# Patient Record
Sex: Female | Born: 1970 | Hispanic: No
Health system: Southern US, Community
[De-identification: ages and names within clinical notes are randomized; demographics above are authoritative.]

## PROBLEM LIST (undated history)

## (undated) DIAGNOSIS — G4700 Insomnia, unspecified: Secondary | ICD-10-CM

## (undated) DIAGNOSIS — F419 Anxiety disorder, unspecified: Secondary | ICD-10-CM

## (undated) HISTORY — DX: Insomnia, unspecified: G47.00

## (undated) HISTORY — DX: Anxiety disorder, unspecified: F41.9

## (undated) HISTORY — PX: BREAST BIOPSY: SHX20

---

## 2009-08-01 ENCOUNTER — Encounter: Admission: RE | Admit: 2009-08-01 | Discharge: 2009-08-01 | Payer: Self-pay | Admitting: Internal Medicine

## 2010-07-02 NOTE — Miscellaneous (Signed)
°

## 2010-07-25 NOTE — Miscellaneous (Signed)
°

## 2018-05-05 ENCOUNTER — Ambulatory Visit: Payer: BC Managed Care – PPO | Admitting: Podiatry

## 2018-05-05 ENCOUNTER — Ambulatory Visit (INDEPENDENT_AMBULATORY_CARE_PROVIDER_SITE_OTHER): Payer: BC Managed Care – PPO

## 2018-05-05 ENCOUNTER — Other Ambulatory Visit: Payer: Self-pay | Admitting: Podiatry

## 2018-05-05 DIAGNOSIS — M76821 Posterior tibial tendinitis, right leg: Secondary | ICD-10-CM

## 2018-05-05 DIAGNOSIS — M7751 Other enthesopathy of right foot: Secondary | ICD-10-CM

## 2018-05-05 DIAGNOSIS — M779 Enthesopathy, unspecified: Secondary | ICD-10-CM

## 2018-05-05 DIAGNOSIS — M79671 Pain in right foot: Secondary | ICD-10-CM

## 2018-05-05 MED ORDER — MELOXICAM 15 MG PO TABS: 15.0000 mg | ORAL_TABLET | Freq: Every day | ORAL | 0 refills | Status: AC

## 2018-05-05 NOTE — Progress Notes (Signed)
Subjective:   Patient ID: Alexandra Guerra, female   DOB: 47 y.o.   MRN: [id number]   HPI 47 year old female presents the office today for concerns of right foot and ankle pain.  She states that about 6 months ago she started developed some pain in the leg and started moved to the ankle now is going into the foot.  He states that the only relief that she is really had is she wraps her foot.  She uses compression wrap.  She does wear Hush Puppies which seem to help as well.  She denies any recent injury or trauma the time of onset of symptoms and she denies any numbness or tingling.  She had one episode of swelling she used ice for not seem to help with that.  She also previously did see orthopedics for this and she was told she had flatfeet and she was given an Ace bandage.  She discusses dull pain is sometimes sharp more of a stiffness.  She has no other concerns.   Review of Systems  All other systems reviewed and are negative.  No past medical history on file.     Current Outpatient Medications:    meloxicam (MOBIC) 15 MG tablet, Take 1 tablet (15 mg total) by mouth daily., Disp: 30 tablet, Rfl: 0  Allergies not on file       Objective:  Physical Exam  General: AAO x3, NAD  Dermatological: Skin is warm, dry and supple bilateral. Nails x 10 are well manicured; remaining integument appears unremarkable at this time. There are no open sores, no preulcerative lesions, no rash or signs of infection present.  Vascular: Dorsalis Pedis artery and Posterior Tibial artery pedal pulses are 2/4 bilateral with immedate capillary fill time. There is no pain with calf compression, swelling, warmth, erythema.   Neruologic: Grossly intact via light touch bilateral. Protective threshold with Semmes Wienstein monofilament intact to all pedal sites bilateral.   Musculoskeletal: There is a mild decrease in medial arch on the right side compared to the contralateral extremity.  There is tenderness  palpation just posterior to the medial malleolus on the course the posterior tibial tendon.  There is mild discomfort on the Achilles tendon but overall the tendons appear to be intact.  She is able to do a double heel rise.  She has discomfort with doing a single heel rise on the right side.  There is no area pinpoint tenderness.  She still gets some tenderness on the arch of the foot on the plantar fascia but overall plantar fascial has been tight.  There is no significant swelling identified to the right lower extremity.  Muscular strength 5/5 in all groups tested bilateral.  Gait: Unassisted, Nonantalgic.       Assessment:   47 year old female with right PTTD/tendonitis    Plan:  -Treatment options discussed including all alternatives, risks, and complications -Etiology of symptoms were discussed -X-rays were obtained and reviewed with the patient. There is no evidence of acute fracture or stress fracture. -Prescribed mobic. Discussed side effects of the medication and directed to stop if any are to occur and call the office.  -Trilock ankle brace dispensed.  -We will start range of motion, rehab exercises as well for postsurgical tendinitis, plantar fasciitis. -We will check orthotic coverage for her as well.  I will see her in about 3 weeks however there is no improvement we ordered an MRI.  Vivi BarrackMatthew R Marice Guidone DPM

## 2018-05-05 NOTE — Patient Instructions (Signed)
Plantar Fasciitis (Heel Spur Syndrome) with Rehab The plantar fascia is a fibrous, ligament-like, soft-tissue structure that spans the bottom of the foot. Plantar fasciitis is a condition that causes pain in the foot due to inflammation of the tissue. SYMPTOMS   Pain and tenderness on the underneath side of the foot.  Pain that worsens with standing or walking. CAUSES  Plantar fasciitis is caused by irritation and injury to the plantar fascia on the underneath side of the foot. Common mechanisms of injury include:  Direct trauma to bottom of the foot.  Damage to a small nerve that runs under the foot where the main fascia attaches to the heel bone.  Stress placed on the plantar fascia due to bone spurs. RISK INCREASES WITH:   Activities that place stress on the plantar fascia (running, jumping, pivoting, or cutting).  Poor strength and flexibility.  Improperly fitted shoes.  Tight calf muscles.  Flat feet.  Failure to warm-up properly before activity.  Obesity. PREVENTION  Warm up and stretch properly before activity.  Allow for adequate recovery between workouts.  Maintain physical fitness:  Strength, flexibility, and endurance.  Cardiovascular fitness.  Maintain a health body weight.  Avoid stress on the plantar fascia.  Wear properly fitted shoes, including arch supports for individuals who have flat feet.  PROGNOSIS  If treated properly, then the symptoms of plantar fasciitis usually resolve without surgery. However, occasionally surgery is necessary.  RELATED COMPLICATIONS   Recurrent symptoms that may result in a chronic condition.  Problems of the lower back that are caused by compensating for the injury, such as limping.  Pain or weakness of the foot during push-off following surgery.  Chronic inflammation, scarring, and partial or complete fascia tear, occurring more often from repeated injections.  TREATMENT  Treatment initially involves  the use of ice and medication to help reduce pain and inflammation. The use of strengthening and stretching exercises may help reduce pain with activity, especially stretches of the Achilles tendon. These exercises may be performed at home or with a therapist. Your caregiver may recommend that you use heel cups of arch supports to help reduce stress on the plantar fascia. Occasionally, corticosteroid injections are given to reduce inflammation. If symptoms persist for greater than 6 months despite non-surgical (conservative), then surgery may be recommended.   MEDICATION   If pain medication is necessary, then nonsteroidal anti-inflammatory medications, such as aspirin and ibuprofen, or other minor pain relievers, such as acetaminophen, are often recommended.  Do not take pain medication within 7 days before surgery.  Prescription pain relievers may be given if deemed necessary by your caregiver. Use only as directed and only as much as you need.  Corticosteroid injections may be given by your caregiver. These injections should be reserved for the most serious cases, because they may only be given a certain number of times.  HEAT AND COLD  Cold treatment (icing) relieves pain and reduces inflammation. Cold treatment should be applied for 10 to 15 minutes every 2 to 3 hours for inflammation and pain and immediately after any activity that aggravates your symptoms. Use ice packs or massage the area with a piece of ice (ice massage).  Heat treatment may be used prior to performing the stretching and strengthening activities prescribed by your caregiver, physical therapist, or athletic trainer. Use a heat pack or soak the injury in warm water.  SEEK IMMEDIATE MEDICAL CARE IF:  Treatment seems to offer no benefit, or the condition worsens.  Any  medications produce adverse side effects.  EXERCISES- RANGE OF MOTION (ROM) AND STRETCHING EXERCISES - Plantar Fasciitis (Heel Spur Syndrome) These  exercises may help you when beginning to rehabilitate your injury. Your symptoms may resolve with or without further involvement from your physician, physical therapist or athletic trainer. While completing these exercises, remember:   Restoring tissue flexibility helps normal motion to return to the joints. This allows healthier, less painful movement and activity.  An effective stretch should be held for at least 30 seconds.  A stretch should never be painful. You should only feel a gentle lengthening or release in the stretched tissue.  RANGE OF MOTION - Toe Extension, Flexion  Sit with your right / left leg crossed over your opposite knee.  Grasp your toes and gently pull them back toward the top of your foot. You should feel a stretch on the bottom of your toes and/or foot.  Hold this stretch for 10 seconds.  Now, gently pull your toes toward the bottom of your foot. You should feel a stretch on the top of your toes and or foot.  Hold this stretch for 10 seconds. Repeat  times. Complete this stretch 3 times per day.   RANGE OF MOTION - Ankle Dorsiflexion, Active Assisted  Remove shoes and sit on a chair that is preferably not on a carpeted surface.  Place right / left foot under knee. Extend your opposite leg for support.  Keeping your heel down, slide your right / left foot back toward the chair until you feel a stretch at your ankle or calf. If you do not feel a stretch, slide your bottom forward to the edge of the chair, while still keeping your heel down.  Hold this stretch for 10 seconds. Repeat 3 times. Complete this stretch 2 times per day.   STRETCH  Gastroc, Standing  Place hands on wall.  Extend right / left leg, keeping the front knee somewhat bent.  Slightly point your toes inward on your back foot.  Keeping your right / left heel on the floor and your knee straight, shift your weight toward the wall, not allowing your back to arch.  You should feel a gentle  stretch in the right / left calf. Hold this position for 10 seconds. Repeat 3 times. Complete this stretch 2 times per day.  STRETCH  Soleus, Standing  Place hands on wall.  Extend right / left leg, keeping the other knee somewhat bent.  Slightly point your toes inward on your back foot.  Keep your right / left heel on the floor, bend your back knee, and slightly shift your weight over the back leg so that you feel a gentle stretch deep in your back calf.  Hold this position for 10 seconds. Repeat 3 times. Complete this stretch 2 times per day.  STRETCH  Gastrocsoleus, Standing  Note: This exercise can place a lot of stress on your foot and ankle. Please complete this exercise only if specifically instructed by your caregiver.   Place the ball of your right / left foot on a step, keeping your other foot firmly on the same step.  Hold on to the wall or a rail for balance.  Slowly lift your other foot, allowing your body weight to press your heel down over the edge of the step.  You should feel a stretch in your right / left calf.  Hold this position for 10 seconds.  Repeat this exercise with a slight bend in your right /  left knee. Repeat 3 times. Complete this stretch 2 times per day.   STRENGTHENING EXERCISES - Plantar Fasciitis (Heel Spur Syndrome)  These exercises may help you when beginning to rehabilitate your injury. They may resolve your symptoms with or without further involvement from your physician, physical therapist or athletic trainer. While completing these exercises, remember:   Muscles can gain both the endurance and the strength needed for everyday activities through controlled exercises.  Complete these exercises as instructed by your physician, physical therapist or athletic trainer. Progress the resistance and repetitions only as guided.  STRENGTH - Towel Curls  Sit in a chair positioned on a non-carpeted surface.  Place your foot on a towel, keeping  your heel on the floor.  Pull the towel toward your heel by only curling your toes. Keep your heel on the floor. Repeat 3 times. Complete this exercise 2 times per day.  STRENGTH - Ankle Inversion  Secure one end of a rubber exercise band/tubing to a fixed object (table, pole). Loop the other end around your foot just before your toes.  Place your fists between your knees. This will focus your strengthening at your ankle.  Slowly, pull your big toe up and in, making sure the band/tubing is positioned to resist the entire motion.  Hold this position for 10 seconds.  Have your muscles resist the band/tubing as it slowly pulls your foot back to the starting position. Repeat 3 times. Complete this exercises 2 times per day.  Document Released: 04/22/2005 Document Revised: 07/15/2011 Document Reviewed: 08/04/2008 [facility] Patient Information 2014 [place], [place].  Posterior Tibial Tendon Tear Rehab Ask your health care provider which exercises are safe for you. Do exercises exactly as told by your health care provider and adjust them as directed. It is normal to feel mild stretching, pulling, tightness, or discomfort as you do these exercises, but you should stop right away if you feel sudden pain or your pain gets worse.Do not begin these exercises until told by your health care provider. Stretching and range of motion exercises These exercises warm up your muscles and joints and improve the movement and flexibility of your ankle. These exercises also help to relieve pain, numbness, and tingling. Exercise A: Gastroc and soleus stretch  1. Sit on the floor with your left / right leg extended. 2. Loop a belt or towel around ball of your left / right foot. The ball of your foot is on the walking surface, right under your toes. 3. Keep your left / right ankle and foot relaxed and keep your knee straight while you use the belt or towel to pull your foot and ankle toward you. You should feel a  gentle stretch behind your calf or knee. 4. Hold this position for __________ seconds. Repeat __________ times. Complete this exercise __________ times a day. Exercise B: Dorsiflexion/plantar flexion  1. Sit with your left / right knee straight or bent. 2. Flex your left / right ankle to tilt the top of your foot toward your shin. 3. Hold this position for __________ seconds. 4. Point your toes downward to tilt the top of your foot away from your shin. 5. Hold this position for __________ seconds. Repeat __________ times with your knee straight and __________ times with your knee bent. Complete this exercise __________ times a day. Exercise C: Ankle plantar flexion, passive  1. Sit with your left / right leg crossed over your opposite knee. 2. Use your opposite hand to pull the top of your  foot and toes toward you. You should feel a gentle stretch on the top of your foot and ankle. 3. Hold this position for __________ seconds. Repeat __________ times. Complete this exercise __________ times a day. Exercise D: Ankle eversion  1. Sit with your left / right ankle crossed over your opposite knee. 2. Grip your left / right foot with your opposite hand, with your thumb on the top of your foot and with your fingers on the bottom of your foot. 3. Gently push your foot downward with a slight rotation so the smallest toes rise slightly toward the ceiling. You should feel a gentle stretch on the inside of your ankle. 4. Hold this stretch for __________ seconds. Repeat __________ times. Complete this exercise __________ times a day. Exercise E: Ankle inversion  1. Sit with your left / right ankle crossed over your opposite knee. 2. Hold your left / right foot with your opposite hand, with your thumb on the bottom of your foot and your fingers on the top of your foot. 3. Gently pull your foot. Your smallest toe should come toward you, and your thumb should be pushing against the ball of your foot. You  should feel a gentle stretch on the outside of your ankle. 4. Hold the stretch for __________ seconds. Repeat __________ times. Complete this exercise __________ times a day. Exercise F: Ankle alphabet  1. Sit with your left / right leg supported at the lower leg. ? Do not rest your foot on anything. ? Make sure your foot has room to move freely. 2. Think of your left / right foot as a paintbrush, and move your foot to trace each letter of the alphabet in the air. Keep your hip and knee still while you trace. 3. Trace every letter from A to Z. Repeat __________ times. Complete this exercise __________ times a day. Strengthening exercises These exercises build strength and endurance in your lower leg. Endurance is the ability to use your muscles for a long time, even after they get tired. Exercise G: Dorsiflexors  1. Secure a rubber exercise band or tube to an object that will not move if it is pulled on, such as a table leg. 2. Secure the other end of the band around your left / right foot. 3. Sit on the floor, facing the object with your left / right leg extended. The band or tube should be slightly tense when your foot is relaxed. 4. Slowly flex your left / right ankle and toes to bring your foot toward you. 5. Hold this position for __________ seconds. 6. Let the band or tube slowly pull your foot back to the starting position. Repeat __________ times. Complete this exercise __________ times a day. Exercise H: Plantar flexors  1. Sit on the floor with your left / right leg extended. 2. Loop a rubber exercise band or tube around the ball of your __________ foot. The ball of your foot is on the walking surface, right under your toes. The band or tube should be slightly tense when your foot is relaxed. 3. Slowly point your toes downward, pushing them away from you. 4. Hold this position for __________ seconds. 5. Let the band or tube slowly pull your foot back to the starting  position. Repeat __________ times. Complete this exercise __________ times a day. Exercise I: Towel curls  1. Sit in a chair on a non-carpeted surface, and put your feet on the floor. 2. Place a towel in front of  your feet. If told by your health care provider, add __________ to the end of the towel. 3. Keeping your heel on the floor, put your left / right foot on the towel. 4. Pull the towel toward you by grabbing the towel with your toes and curling them under. Keep your heel on the floor. Repeat __________ times. Complete this exercise __________ times a day. This information is not intended to replace advice given to you by your health care provider. Make sure you discuss any questions you have with your health care provider. Document Released: 04/22/2005 Document Revised: 12/28/2015 Document Reviewed: 04/16/2015 Elsevier Interactive Patient Education  2019 [employer].

## 2018-05-05 NOTE — Progress Notes (Signed)
Dg  

## 2018-05-26 ENCOUNTER — Ambulatory Visit: Payer: BC Managed Care – PPO | Admitting: Podiatry

## 2018-10-14 ENCOUNTER — Other Ambulatory Visit: Payer: Self-pay | Admitting: Obstetrics and Gynecology

## 2018-10-14 ENCOUNTER — Other Ambulatory Visit (HOSPITAL_COMMUNITY)
Admission: RE | Admit: 2018-10-14 | Discharge: 2018-10-14 | Disposition: A | Discharge status: Home / Self Care | Payer: BC Managed Care – PPO | Source: Ambulatory Visit | Attending: Obstetrics and Gynecology | Admitting: Obstetrics and Gynecology

## 2018-10-14 DIAGNOSIS — Z124 Encounter for screening for malignant neoplasm of cervix: Secondary | ICD-10-CM | POA: Insufficient documentation

## 2018-10-15 ENCOUNTER — Other Ambulatory Visit: Payer: Self-pay | Admitting: Obstetrics and Gynecology

## 2018-10-15 DIAGNOSIS — Z1231 Encounter for screening mammogram for malignant neoplasm of breast: Secondary | ICD-10-CM

## 2018-10-15 LAB — CYTOLOGY - PAP
Diagnosis: NEGATIVE
HPV: NOT DETECTED

## 2018-10-23 ENCOUNTER — Ambulatory Visit
Admission: RE | Admit: 2018-10-23 | Discharge: 2018-10-23 | Disposition: A | Discharge status: Home / Self Care | Payer: BC Managed Care – PPO | Source: Ambulatory Visit | Attending: Obstetrics and Gynecology | Admitting: Obstetrics and Gynecology

## 2018-10-23 ENCOUNTER — Other Ambulatory Visit: Payer: Self-pay

## 2018-10-23 DIAGNOSIS — Z1231 Encounter for screening mammogram for malignant neoplasm of breast: Secondary | ICD-10-CM

## 2018-10-26 ENCOUNTER — Other Ambulatory Visit: Payer: Self-pay | Admitting: Obstetrics and Gynecology

## 2018-10-26 DIAGNOSIS — N63 Unspecified lump in unspecified breast: Secondary | ICD-10-CM

## 2018-11-24 ENCOUNTER — Ambulatory Visit
Admission: RE | Admit: 2018-11-24 | Discharge: 2018-11-24 | Disposition: A | Discharge status: Home / Self Care | Payer: BC Managed Care – PPO | Source: Ambulatory Visit | Attending: Obstetrics and Gynecology | Admitting: Obstetrics and Gynecology

## 2018-11-24 ENCOUNTER — Ambulatory Visit: Payer: BC Managed Care – PPO

## 2018-11-24 ENCOUNTER — Other Ambulatory Visit: Payer: Self-pay

## 2018-11-24 DIAGNOSIS — N63 Unspecified lump in unspecified breast: Secondary | ICD-10-CM

## 2020-09-22 NOTE — Progress Notes (Signed)
NEUROLOGY CONSULTATION NOTE  Alexandra Guerra MRN: [id number] DOB: 03-04-1971  Referring provider: Gerald Leitz, MD Primary care provider: Gerald Leitz, MD  Reason for consult:  headaches  Assessment/Plan:   1.  Primary stabbing headache 2.  Tension-type headache 3.  Pulsatile tinnitus in left ear 4.  Elevated blood pressure  1.  Start amitriptyline 50mg  at bedtime 2.  MRA head and neck 3.  Limit use of pain relievers to no more than 2 days out of week to prevent risk of rebound or medication-overuse headache. 4.  Keep headache diary 5.  Follow up with PCP regarding blood pressure 6.  Follow up in 6 months.   Subjective:  Alexandra Guerra is a 50 year old right-handed female who presents for cluster headache.  History supplemented by referring provider's note.  Onset of headaches for many years, worse since around 2017.  Headache 1:  Severe stabbing headache either side, lasting seconds, sometimes associated with dizziness, occurs once in awhile.  No aura.  May be triggered by stress.   Headache 2:  Severe pressure back of head radiating down back of neck and into shoulders.  Associated with dizziness.  No aura.  They last 1 to 2 hours.  They may occur once a month.  Triggered by stress.  Keeps her in bed.  Relieved by rest/sleep in quiet room.  She also reports intermittent pulsatile tinnitus in the left ear, noticeable when laying down daily. Noticeable over past 6 months.  Recently had her thyroid checked, which was normal.  Current NSAIDS/analgesics:  Ibuprofen (does not treat all of the time) Current triptans:  none Current ergotamine:  none Current anti-emetic:  none Current muscle relaxants:  none Current Antihypertensive medications:  none Current Antidepressant medications:  Amitriptyline 50mg  QHS (for sleep - does not take every night) Current Anticonvulsant medications:  none Current anti-CGRP:  none Current Vitamins/Herbal/Supplements:  none Current  Antihistamines/Decongestants:  none Other therapy:  none Hormone/birth control:  none Other medications:  none  Past NSAIDS/analgesics:  Goody powder Past abortive triptans:  none Past abortive ergotamine:  none Past muscle relaxants:  none Past anti-emetic:  none Past antihypertensive medications:  none Past antidepressant medications:  none Past anticonvulsant medications:  none Past anti-CGRP:  none Past vitamins/Herbal/Supplements:  none Past antihistamines/decongestants:  none Other past therapies:  none  No known family history of headaches.        PAST MEDICAL HISTORY: No past medical history on file.  PAST SURGICAL HISTORY: Past Surgical History:  Procedure Laterality Date  . BREAST BIOPSY Left    2012 benign    MEDICATIONS: Current Outpatient Medications on File Prior to Visit  Medication Sig Dispense Refill  . clindamycin (CLEOCIN T) 1 % external solution Apply topically 2 (two) times daily.    . Vitamin D, Ergocalciferol, (DRISDOL) 1.25 MG (50000 UNIT) CAPS capsule Take 1 capsule by mouth once a week.     No current facility-administered medications on file prior to visit.    ALLERGIES: NKDA  FAMILY HISTORY: Family History  Problem Relation Age of Onset  . Breast cancer Mother 63    Objective:  Blood pressure (!) 160/107, pulse (!) 101, height 5\' 1"  (1.549 m), weight 203 lb (92.1 kg), SpO2 96 %. General: No acute distress.  Patient appears well-groomed.   Head:  Normocephalic/atraumatic Eyes:  fundi examined but not visualized Neck: supple, no paraspinal tenderness, full range of motion Back: No paraspinal tenderness Heart: regular rate and rhythm Lungs: Clear to auscultation  bilaterally. Vascular: No carotid bruits. Neurological Exam: Mental status: alert and oriented to person, place, and time, recent and remote memory intact, fund of knowledge intact, attention and concentration intact, speech fluent and not dysarthric, language  intact. Cranial nerves: CN I: not tested CN II: pupils equal, round and reactive to light, visual fields intact CN III, IV, VI:  full range of motion, no nystagmus, no ptosis CN V: facial sensation intact. CN VII: upper and lower face symmetric CN VIII: hearing intact CN IX, X: gag intact, uvula midline CN XI: sternocleidomastoid and trapezius muscles intact CN XII: tongue midline Bulk & Tone: normal, no fasciculations. Motor:  muscle strength 5/5 throughout Sensation:  Pinprick, temperature and vibratory sensation intact. Deep Tendon Reflexes:  2+ throughout,  toes downgoing.   Finger to nose testing:  Without dysmetria.   Heel to shin:  Without dysmetria.   Gait:  Normal station and stride.  Romberg negative.    Thank you for allowing me to take part in the care of this patient.  Shon Millet, DO  CC: Gerald Leitz, MD

## 2020-09-25 ENCOUNTER — Encounter: Payer: Self-pay | Admitting: Neurology

## 2020-09-25 ENCOUNTER — Ambulatory Visit: Payer: BC Managed Care – PPO | Admitting: Neurology

## 2020-09-25 ENCOUNTER — Other Ambulatory Visit: Payer: Self-pay

## 2020-09-25 VITALS — BP 160/107 | HR 101 | Ht 61.0 in | Wt 203.0 lb

## 2020-09-25 DIAGNOSIS — G44219 Episodic tension-type headache, not intractable: Secondary | ICD-10-CM | POA: Diagnosis not present

## 2020-09-25 DIAGNOSIS — G4485 Primary stabbing headache: Secondary | ICD-10-CM | POA: Diagnosis not present

## 2020-09-25 DIAGNOSIS — H93A2 Pulsatile tinnitus, left ear: Secondary | ICD-10-CM

## 2020-09-25 MED ORDER — AMITRIPTYLINE HCL 50 MG PO TABS: 50.0000 mg | ORAL_TABLET | Freq: Every day | ORAL | 5 refills | Status: DC

## 2020-09-25 NOTE — Patient Instructions (Addendum)
1.  Start amitriptyline 50mg  at bedtime 2.  Limit use of pain relievers to no more than 2 days out of week to prevent risk of rebound or medication-overuse headache. 3.  Keep headache diary 4.  Check MRA of head and neck 5.  Follow up 6 months.

## 2020-09-28 NOTE — Progress Notes (Signed)
Health Plan: Solution Name: [facility] Scheduled Date of Service: 09/28/2020 Member Information Alexandra Guerra, Alexandra Guerra Member #: [id number] [address] [place], [place], [address] Date Of Birth: 1971-01-28 Phone:     Based on the date and type of service entered, prior authorization by [facility] is not required. Please contact the health plan if additional information is needed.       = Termed Members

## 2020-10-11 ENCOUNTER — Ambulatory Visit
Admission: RE | Admit: 2020-10-11 | Discharge: 2020-10-11 | Disposition: A | Discharge status: Home / Self Care | Payer: BC Managed Care – PPO | Source: Ambulatory Visit | Attending: Neurology | Admitting: Neurology

## 2020-10-11 ENCOUNTER — Other Ambulatory Visit: Payer: Self-pay

## 2020-10-11 DIAGNOSIS — G4485 Primary stabbing headache: Secondary | ICD-10-CM

## 2020-10-11 DIAGNOSIS — G44219 Episodic tension-type headache, not intractable: Secondary | ICD-10-CM

## 2020-10-11 DIAGNOSIS — H93A2 Pulsatile tinnitus, left ear: Secondary | ICD-10-CM

## 2020-10-11 MED ORDER — GADOBENATE DIMEGLUMINE 529 MG/ML IV SOLN
20.0000 mL | Freq: Once | INTRAVENOUS | Status: AC | PRN
  Administered 2020-10-11: 20 mL via INTRAVENOUS

## 2020-10-12 NOTE — Progress Notes (Signed)
Pt advised Of her MRA results.

## 2021-03-28 ENCOUNTER — Ambulatory Visit: Payer: BC Managed Care – PPO | Admitting: Neurology

## 2021-04-08 ENCOUNTER — Other Ambulatory Visit: Payer: Self-pay | Admitting: Neurology

## 2021-09-27 ENCOUNTER — Other Ambulatory Visit: Payer: Self-pay | Admitting: Neurology

## 2021-10-02 NOTE — Progress Notes (Deleted)
NEUROLOGY FOLLOW UP OFFICE NOTE  DZYRE CAZENAVE SN:[id number]  Assessment/Plan:   1.  Primary stabbing headache 2.  Tension type headache, not intractable 3.  Pulsatile tinnitus in left ear.  Unclear etiology. 4.  Elevated blood pressure   1.  Start amitriptyline 50mg  at bedtime 2.  MRA head and neck 3.  Limit use of pain relievers to no more than 2 days out of week to prevent risk of rebound or medication-overuse headache. 4.  Keep headache diary 5.  Follow up with PCP regarding blood pressure 6.  Follow up in 6 months.     Subjective:  Alexandra Guerra is a 51 year old right-handed female who follows up for headache.  UPDATE: Last seen in May 2022. On amitriptyline.  Intensity:  *** Duration:  *** Frequency:  ***  For further evaluation of left sided pulsatile tinnitus, underwent MRA of head and neck on 10/11/2020 which were personally reviewed and were normal.  Frequency of abortive medication: *** Current NSAIDS/analgesics:  Ibuprofen (does not treat all of the time) Current triptans:  none Current ergotamine:  none Current anti-emetic:  none Current muscle relaxants:  none Current Antihypertensive medications:  none Current Antidepressant medications:  Amitriptyline 50mg  QHS (for sleep - does not take every night) Current Anticonvulsant medications:  none Current anti-CGRP:  none Current Vitamins/Herbal/Supplements:  none Current Antihistamines/Decongestants:  none Other therapy:  none Hormone/birth control:  none Other medications:  none   HISTORY:  Onset of headaches for many years, worse since around 2017.   Headache 1:  Severe stabbing headache either side, lasting seconds, sometimes associated with dizziness, occurs once in awhile.  No aura.  May be triggered by stress.    Headache 2:  Severe pressure back of head radiating down back of neck and into shoulders.  Associated with dizziness.  No aura.  They last 1 to 2 hours.  They may occur once a month.   Triggered by stress.  Keeps her in bed.  Relieved by rest/sleep in quiet room.   She also reports intermittent pulsatile tinnitus in the left ear, noticeable when laying down daily. Noticeable over past 6 months.   Thyroid testing was normal.     Past NSAIDS/analgesics:  Goody powder Past abortive triptans:  none Past abortive ergotamine:  none Past muscle relaxants:  none Past anti-emetic:  none Past antihypertensive medications:  none Past antidepressant medications:  none Past anticonvulsant medications:  none Past anti-CGRP:  none Past vitamins/Herbal/Supplements:  none Past antihistamines/decongestants:  none Other past therapies:  none   No known family history of headaches.    PAST MEDICAL HISTORY: Past Medical History:  Diagnosis Date   Anxiety    Insomnia     MEDICATIONS: Current Outpatient Medications on File Prior to Visit  Medication Sig Dispense Refill   amitriptyline (ELAVIL) 50 MG tablet TAKE 1 TABLET(50 MG) BY MOUTH AT BEDTIME 30 tablet 5   clindamycin (CLEOCIN T) 1 % external solution Apply topically 2 (two) times daily.     Vitamin D, Ergocalciferol, (DRISDOL) 1.25 MG (50000 UNIT) CAPS capsule Take 1 capsule by mouth once a week.     No current facility-administered medications on file prior to visit.    ALLERGIES: Allergies  Allergen Reactions   Gadolinium Derivatives Nausea Only    NAUSEA ONLY. PATIENT NEEDED NO OTHER TREATMENT.     FAMILY HISTORY: Family History  Problem Relation Age of Onset   Breast cancer Mother 30   Dementia Maternal Grandmother  Dementia Paternal Grandmother       Objective:  *** General: No acute distress.  Patient appears ***-groomed.   Head:  Normocephalic/atraumatic Eyes:  Fundi examined but not visualized Neck: supple, no paraspinal tenderness, full range of motion Heart:  Regular rate and rhythm Lungs:  Clear to auscultation bilaterally Back: No paraspinal tenderness Neurological Exam: alert and  oriented to person, place, and time.  Speech fluent and not dysarthric, language intact.  CN II-XII intact. Bulk and tone normal, muscle strength 5/5 throughout.  Sensation to light touch intact.  Deep tendon reflexes 2+ throughout, toes downgoing.  Finger to nose testing intact.  Gait normal, Romberg negative.   Metta Clines, DO  CC: ***

## 2021-10-03 ENCOUNTER — Ambulatory Visit: Payer: BC Managed Care – PPO | Admitting: Neurology

## 2021-10-03 ENCOUNTER — Encounter: Payer: Self-pay | Admitting: Neurology

## 2021-10-03 DIAGNOSIS — Z029 Encounter for administrative examinations, unspecified: Secondary | ICD-10-CM

## 2022-04-19 IMAGING — MR MR MRA NECK WO/W CM
2 series · 24 of 48 positions shown · IV contrast (20ml Multihance)
Comparison: None available.

CLINICAL DATA: Initial evaluation for pulsatile tinnitus,
headaches.

EXAM:
MRA HEAD WITHOUT CONTRAST
MRA NECK WITHOUT AND WITH CONTRAST
TECHNIQUE: Angiographic images of the Circle of Willis were acquired using MRA
technique without intravenous contrast. Angiographic images of the
neck were acquired using MRA technique without and with intravenous
contrast. Carotid stenosis measurements (when applicable) are
obtained utilizing NASCET criteria, using the distal internal
carotid diameter as the denominator.
CONTRAST:  20mL MULTIHANCE GADOBENATE DIMEGLUMINE 529 MG/ML IV SOLN

[Series 4: [id number] · axial · 3.0mm · 0.39mm/px · z∈[-185,-93]mm · 14 of 50 slices shown]
[im 1/50]
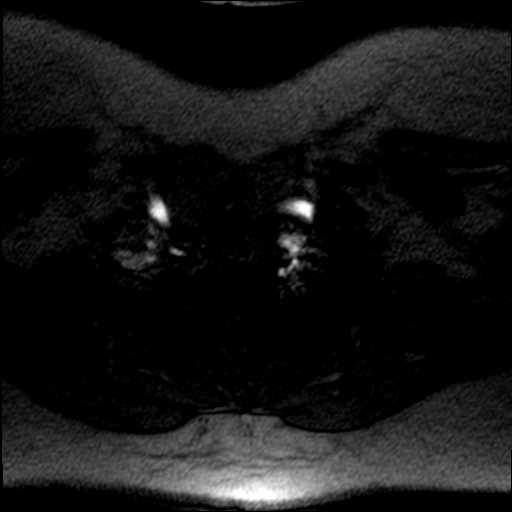
[im 3/50]
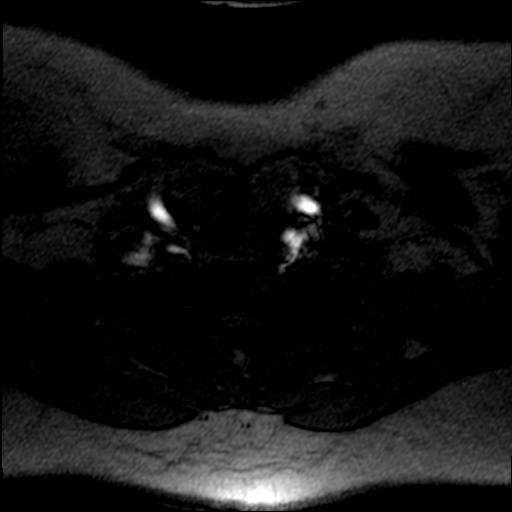
[im 6/50]
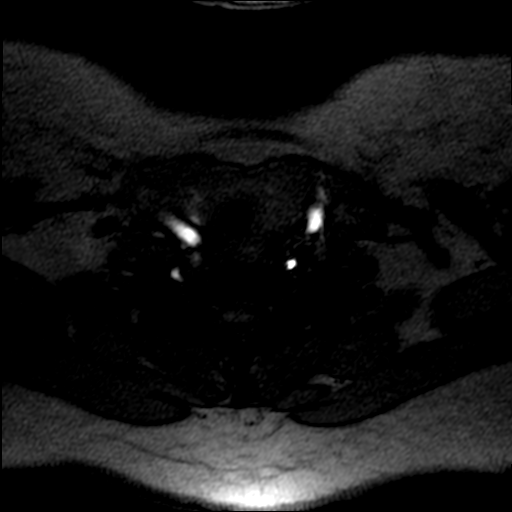
[im 9/50]
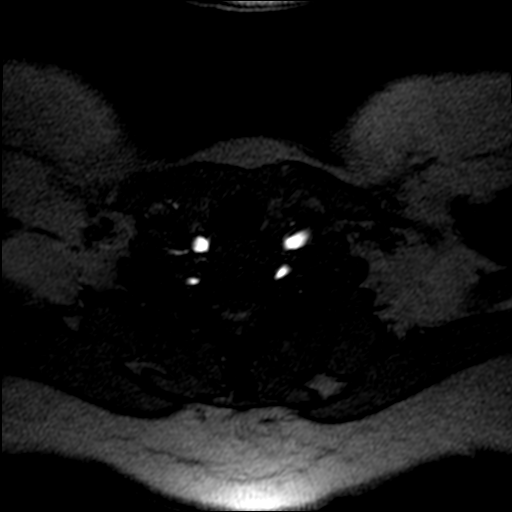
[im 12/50]
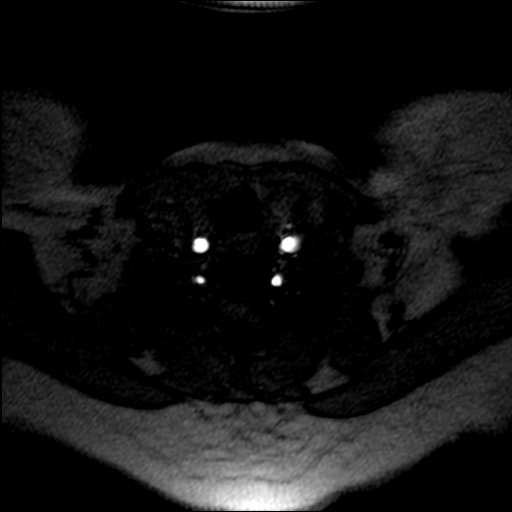
[im 15/50]
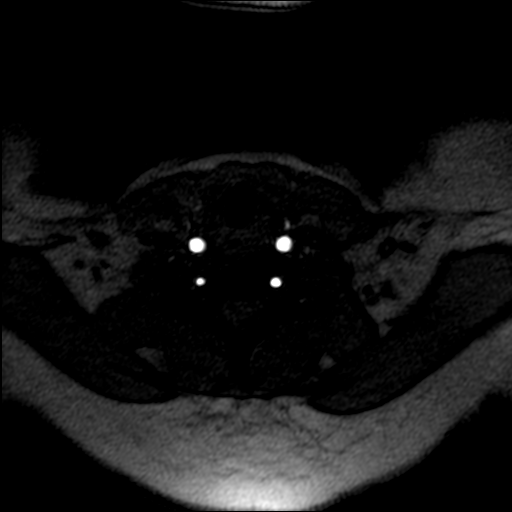
[im 18/50]
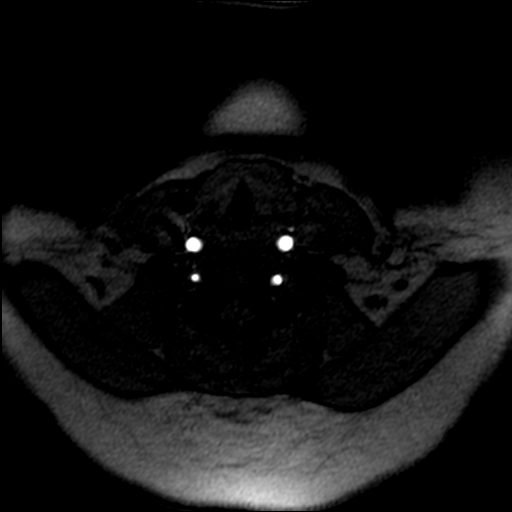
[im 21/50]
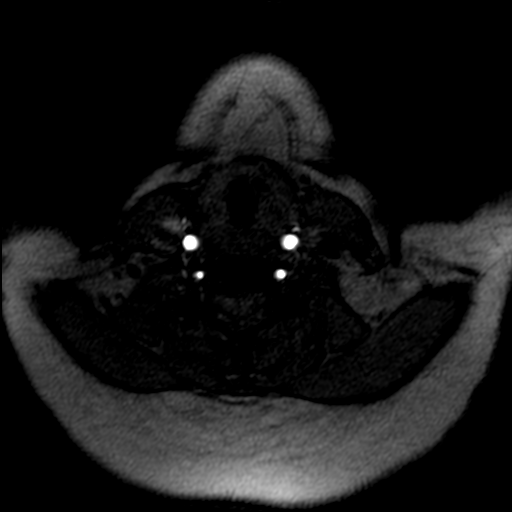
[im 26/50]
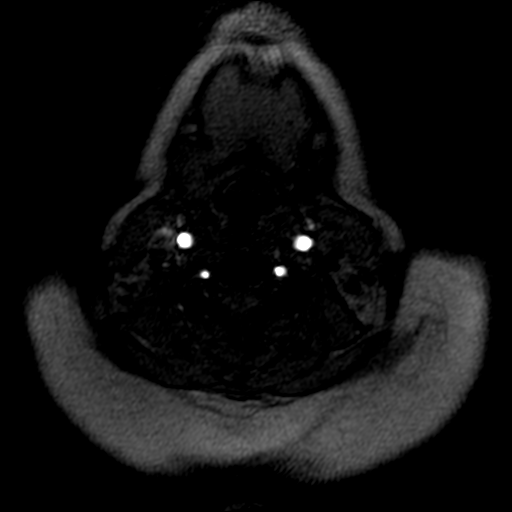
[im 29/50]
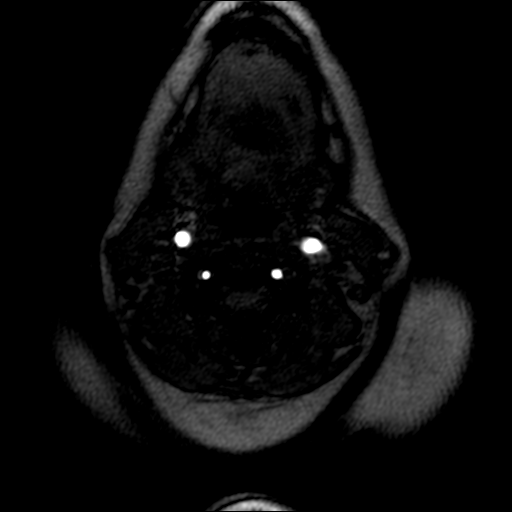
[im 35/50]
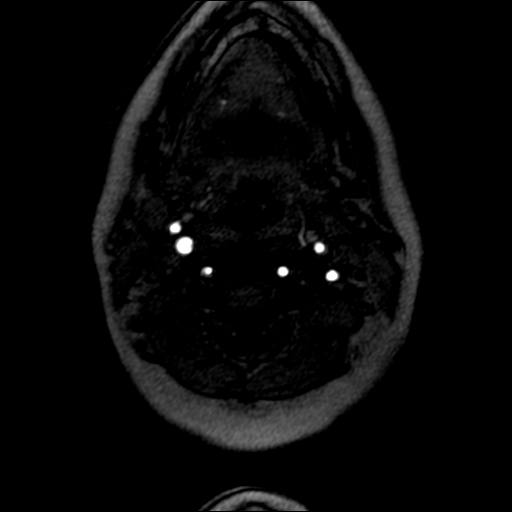
[im 41/50]
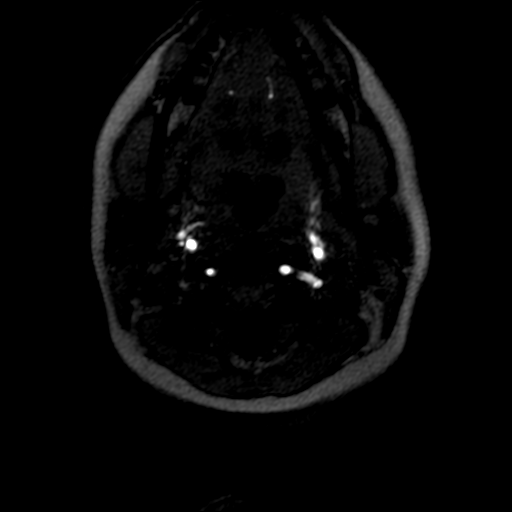
[im 44/50]
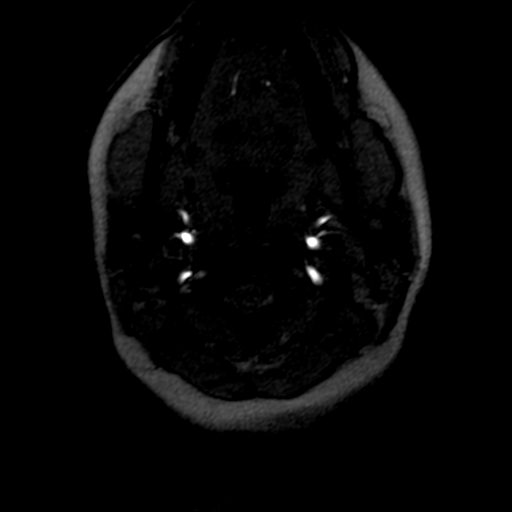
[im 47/50]
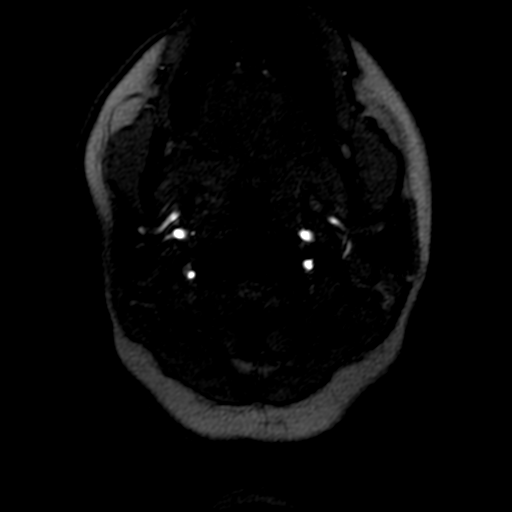

[Series 11: (id)_tt=1.0s · coronal · 0.8mm · 0.78mm/px · 10 of 80 slices shown]
[im 3/80]
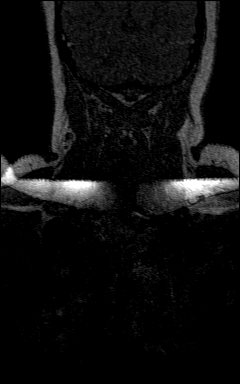
[im 14/80]
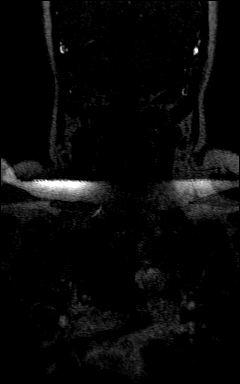
[im 25/80]
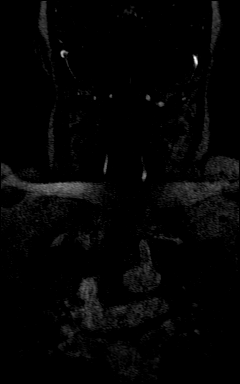
[im 36/80]
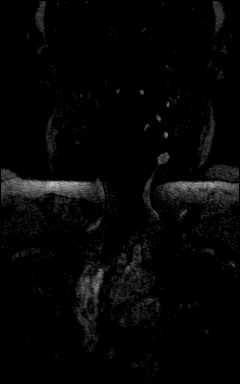
[im 41/80]
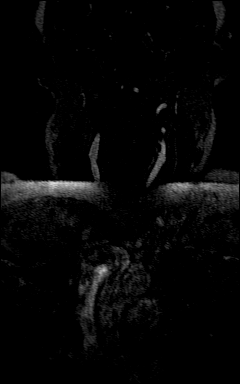
[im 44/80]
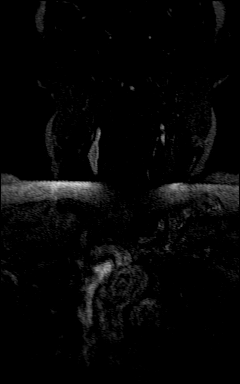
[im 55/80]
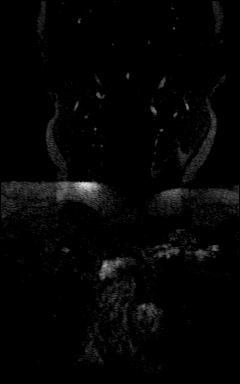
[im 66/80]
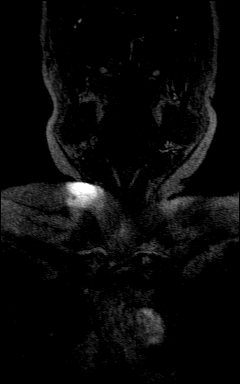
[im 69/80]
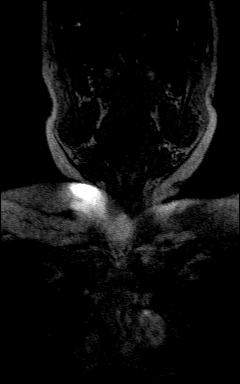
[im 77/80]
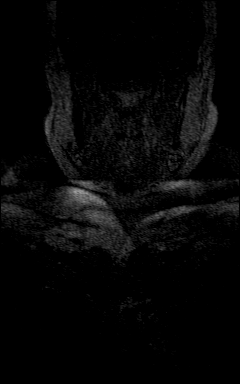

[24 of 48 positions shown; findings below may reference images not displayed]

FINDINGS: MRA HEAD FINDINGS

ANTERIOR CIRCULATION:

Both internal carotid arteries widely patent to the termini without
stenosis. A1 segments widely patent. Normal anterior communicating
artery complex. Both anterior cerebral arteries widely patent to
their distal aspects without stenosis. No M1 stenosis or occlusion.
Normal MCA bifurcations. Distal MCA branches well perfused and
symmetric.

POSTERIOR CIRCULATION:

Both V4 segments patent to the vertebrobasilar junction without
stenosis. Left vertebral artery dominant. Both PICA origins patent
and normal. Basilar widely patent to its distal aspect without
stenosis. Superior cerebellar arteries patent bilaterally. Both PCAs
primarily supplied via the basilar and are well perfused to there
distal aspects.

No aneurysm or other vascular malformation.  Inter caval cyst noted.

MRA NECK FINDINGS

AORTIC ARCH: Visualized aortic arch normal caliber with normal 3
vessel morphology. No hemodynamically significant stenosis seen
about the origin of the great vessels.

RIGHT CAROTID SYSTEM: Right CCA patent to the bifurcation without
stenosis. No significant atheromatous irregularity or narrowing
about the right bifurcation. Right ICA patent distally without
stenosis, evidence for dissection or occlusion.

LEFT CAROTID SYSTEM: Left CCA patent from its origin to the
bifurcation without stenosis. No significant atheromatous
irregularity or narrowing about the left bifurcation. Left ICA
patent distally without stenosis, evidence for dissection or
occlusion.

VERTEBRAL ARTERIES: Both vertebral arteries arise from the
subclavian arteries. Neither vertebral artery origin well evaluated
on this exam due to motion and artifact. Visualized portions of the
vertebral arteries patent without stenosis, evidence for dissection,
or occlusion. Apparent signal loss involving the proximal-mid
vertebral artery on postcontrast sequence felt to be artifactual, as
the right vertebral artery does appear patent on corresponding
time-of-flight sequence.
IMPRESSION: Normal MRA of the head and neck. No findings to explain patient's
symptoms identified.

## 2022-10-22 ENCOUNTER — Other Ambulatory Visit: Payer: Self-pay | Admitting: Internal Medicine

## 2022-10-22 DIAGNOSIS — N644 Mastodynia: Secondary | ICD-10-CM
